# Patient Record
Sex: Female | Born: 1975 | Race: White | Hispanic: No | Marital: Married
Health system: Southern US, Community
[De-identification: ages and names within clinical notes are randomized; demographics above are authoritative.]

## PROBLEM LIST (undated history)

## (undated) HISTORY — PX: BARIATRIC SURGERY: SHX1103

## (undated) HISTORY — PX: ABDOMINAL HYSTERECTOMY: SHX81

---

## 2016-09-22 ENCOUNTER — Encounter (HOSPITAL_COMMUNITY): Payer: Self-pay | Admitting: Obstetrics and Gynecology

## 2016-09-22 DIAGNOSIS — N201 Calculus of ureter: Secondary | ICD-10-CM | POA: Insufficient documentation

## 2016-09-22 DIAGNOSIS — Z79899 Other long term (current) drug therapy: Secondary | ICD-10-CM | POA: Insufficient documentation

## 2016-09-22 DIAGNOSIS — R109 Unspecified abdominal pain: Secondary | ICD-10-CM | POA: Diagnosis present

## 2016-09-22 LAB — LIPASE, BLOOD: Lipase: 37 U/L (ref 11–51)

## 2016-09-22 LAB — CBC
HEMATOCRIT: 38.9 % (ref 36.0–46.0)
HEMOGLOBIN: 13.7 g/dL (ref 12.0–15.0)
MCH: 29.8 pg (ref 26.0–34.0)
MCHC: 35.2 g/dL (ref 30.0–36.0)
MCV: 84.7 fL (ref 78.0–100.0)
Platelets: 256 10*3/uL (ref 150–400)
RBC: 4.59 MIL/uL (ref 3.87–5.11)
RDW: 15 % (ref 11.5–15.5)
WBC: 9.7 10*3/uL (ref 4.0–10.5)

## 2016-09-22 LAB — COMPREHENSIVE METABOLIC PANEL
ALBUMIN: 4 g/dL (ref 3.5–5.0)
ALT: 38 U/L (ref 14–54)
ANION GAP: 14 (ref 5–15)
AST: 24 U/L (ref 15–41)
Alkaline Phosphatase: 65 U/L (ref 38–126)
BILIRUBIN TOTAL: 2.4 mg/dL — AB (ref 0.3–1.2)
BUN: 12 mg/dL (ref 6–20)
CHLORIDE: 103 mmol/L (ref 101–111)
CO2: 20 mmol/L — AB (ref 22–32)
Calcium: 8.9 mg/dL (ref 8.9–10.3)
Creatinine, Ser: 0.93 mg/dL (ref 0.44–1.00)
GFR calc Af Amer: >60 mL/min (ref >60)
GFR calc non Af Amer: >60 mL/min (ref >60)
GLUCOSE: 105 mg/dL — AB (ref 65–99)
POTASSIUM: 3.1 mmol/L — AB (ref 3.5–5.1)
SODIUM: 137 mmol/L (ref 135–145)
TOTAL PROTEIN: 7.3 g/dL (ref 6.5–8.1)

## 2016-09-22 LAB — I-STAT BETA HCG BLOOD, ED (MC, WL, AP ONLY): I-stat hCG, quantitative: <5 m[IU]/mL (ref <5)

## 2016-09-22 NOTE — ED Triage Notes (Signed)
Pt presents to the ED with pain in her abdomen and left side, radiating to back. Pt states she had bariatric surgery about 3 weeks ago. She has had nausea and been vomiting. Pt states she is currently vomiting bile, as she has been unable to hold down food or drink.

## 2016-09-23 ENCOUNTER — Emergency Department (HOSPITAL_COMMUNITY): Payer: Medicaid Other

## 2016-09-23 ENCOUNTER — Encounter (HOSPITAL_COMMUNITY): Payer: Self-pay | Admitting: Radiology

## 2016-09-23 ENCOUNTER — Emergency Department (HOSPITAL_COMMUNITY)
Admission: EM | Admit: 2016-09-23 | Discharge: 2016-09-23 | Disposition: A | Discharge status: Home / Self Care | Payer: Medicaid Other | Attending: Emergency Medicine | Admitting: Emergency Medicine

## 2016-09-23 DIAGNOSIS — N201 Calculus of ureter: Secondary | ICD-10-CM

## 2016-09-23 LAB — URINALYSIS, ROUTINE W REFLEX MICROSCOPIC
BACTERIA UA: NONE SEEN
Bilirubin Urine: NEGATIVE
Glucose, UA: NEGATIVE mg/dL
KETONES UR: 80 mg/dL — AB
Leukocytes, UA: NEGATIVE
Nitrite: NEGATIVE
PH: 5 (ref 5.0–8.0)
PROTEIN: 30 mg/dL — AB
Specific Gravity, Urine: >1.046 — ABNORMAL HIGH (ref 1.005–1.030)

## 2016-09-23 MED ORDER — ONDANSETRON 4 MG PO TBDP: 4.0000 mg | ORAL_TABLET | Freq: Three times a day (TID) | ORAL | 0 refills | Status: AC | PRN

## 2016-09-23 MED ORDER — HYDROMORPHONE HCL 2 MG/ML IJ SOLN
1.0000 mg | Freq: Once | INTRAMUSCULAR | Status: AC
  Administered 2016-09-23: 1 mg via INTRAVENOUS
  Filled 2016-09-23: qty 1

## 2016-09-23 MED ORDER — IOPAMIDOL (ISOVUE-300) INJECTION 61%
INTRAVENOUS | Status: AC
  Filled 2016-09-23: qty 100

## 2016-09-23 MED ORDER — SODIUM CHLORIDE 0.9 % IV BOLUS (SEPSIS)
1000.0000 mL | Freq: Once | INTRAVENOUS | Status: AC
  Administered 2016-09-23: 1000 mL via INTRAVENOUS

## 2016-09-23 MED ORDER — ONDANSETRON HCL 4 MG/2ML IJ SOLN
4.0000 mg | Freq: Once | INTRAMUSCULAR | Status: AC
  Administered 2016-09-23: 4 mg via INTRAVENOUS
  Filled 2016-09-23: qty 2

## 2016-09-23 MED ORDER — OXYCODONE-ACETAMINOPHEN 5-325 MG PO TABS
1.0000 | ORAL_TABLET | Freq: Four times a day (QID) | ORAL | 0 refills | Status: AC | PRN
Start: 2016-09-23 — End: ?

## 2016-09-23 MED ORDER — OXYCODONE-ACETAMINOPHEN 5-325 MG PO TABS
2.0000 | ORAL_TABLET | Freq: Once | ORAL | Status: AC
  Administered 2016-09-23: 2 via ORAL
  Filled 2016-09-23: qty 2

## 2016-09-23 MED ORDER — KETOROLAC TROMETHAMINE 30 MG/ML IJ SOLN
30.0000 mg | Freq: Once | INTRAMUSCULAR | Status: AC
  Administered 2016-09-23: 30 mg via INTRAVENOUS
  Filled 2016-09-23: qty 1

## 2016-09-23 MED ORDER — TAMSULOSIN HCL 0.4 MG PO CAPS: 0.4000 mg | ORAL_CAPSULE | Freq: Every day | ORAL | 0 refills | Status: AC

## 2016-09-23 MED ORDER — IOPAMIDOL (ISOVUE-300) INJECTION 61%
100.0000 mL | Freq: Once | INTRAVENOUS | Status: AC | PRN
  Administered 2016-09-23: 100 mL via INTRAVENOUS

## 2016-09-23 NOTE — ED Provider Notes (Signed)
[facility]-EMERGENCY DEPT Provider Note   CSN: [id number] Arrival date & time: 09/22/16  2207  By signing my name below, I, [name], attest that this documentation has been prepared under the direction and in the presence of [name], PA-C. Electronically Signed: [name], Scribe. 09/23/2016. 12:27 AM.  History   Chief Complaint Chief Complaint  Patient presents with  . Abdominal Pain   The history is provided by the patient and medical records. No language interpreter was used.    HPI Comments:  Grace Francis is a 41 y.o. female with a PSHx of Abdominal Hysterectomy, who presents to the Emergency Department complaining of gradually worsening, radiating, left-sided abdominal pain onset yesterday. 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 She states coming to [place] from [place] for [employer] and experiencing this abdominal pain with worsening nausea beginning yesterday. Following the procedure three weeks ago, she became dehydrated and developed C-Diff treated with 10 days of oral vancomycin. Pt reports reduced PO intake d/t frequent vomiting s/p ingestion throughout today. Prior h/o renal calculi noted. Pt has associated symptoms of diarrhea that has been present since the C-Diff infection, back pain, chills, and reduced urination. Direct palpation and pressure exacerbate her pain. Pt denies fever, dysuria, hematuria, and any other complaints at this time.   History reviewed. No pertinent past medical history.  There are no active problems to display for this patient.  Past Surgical History:  Procedure Laterality Date  . ABDOMINAL HYSTERECTOMY    . BARIATRIC SURGERY Bilateral    OB History    No data available     Home Medications    Prior to Admission medications   Medication Sig Start Date End Date Taking? Authorizing Provider  citalopram (CELEXA) 40 MG tablet Take 40 mg by mouth daily. 07/22/16  Yes  Historical Provider, MD  clonazePAM (KLONOPIN) 1 MG tablet Take 1 mg by mouth at bedtime as needed for anxiety (sleep).   Yes Historical Provider, MD  Cyanocobalamin (VITAMIN B 12 PO) Take 1 tablet by mouth daily.   Yes Historical Provider, MD  Multiple Vitamins-Minerals (BARIATRIC FUSION PO) Take 1 tablet by mouth daily.   Yes Historical Provider, MD  pantoprazole (PROTONIX) 20 MG tablet Take 20 mg by mouth daily. 08/16/16  Yes Historical Provider, MD   Family History No family history on file.  Social History Social History  Substance Use Topics  . Smoking status: Not on file  . Smokeless tobacco: Current User  . Alcohol use No    Allergies   Iodine and Tramadol   Review of Systems Review of Systems  Constitutional: Positive for appetite change and chills. Negative for fever.  HENT: Negative for rhinorrhea and sore throat.   Eyes: Negative for redness.  Respiratory: Negative for cough.   Cardiovascular: Negative for chest pain.  Gastrointestinal: Positive for abdominal pain, diarrhea, nausea and vomiting.  Genitourinary: Positive for decreased urine volume. Negative for dysuria and hematuria.  Musculoskeletal: Positive for back pain. Negative for myalgias.  Skin: Negative for rash.  Neurological: Negative for headaches.     Physical Exam Updated Vital Signs BP (!) 131/59 (BP Location: Right Arm)   Pulse 67   Temp 98 F (36.7 C) (Oral)   Resp 14   Ht  (1.702 m)   Wt (!) 330 lb (149.7 kg)   SpO2 100%   BMI 51.69 kg/m   Physical Exam  Constitutional: She appears well-developed and well-nourished.  HENT:  Head: Normocephalic and atraumatic.  Mouth/Throat: Oropharynx is clear and moist.  Eyes: Conjunctivae are normal. Right eye exhibits no discharge. Left eye exhibits no discharge.  Neck: Normal range of motion. Neck supple.  Cardiovascular: Normal rate, regular rhythm and normal heart sounds.   Pulmonary/Chest: Effort normal and breath sounds normal. No  respiratory distress. She has no wheezes. She has no rales.  Abdominal: Soft. She exhibits no distension and no mass. There is tenderness (Generalized, worse left flank and upper abdomen, mild to moderate). There is no guarding.  Postsurgical wounds appear well-healing.  Musculoskeletal: Normal range of motion.  Neurological: She is alert.  Skin: Skin is warm and dry.  Psychiatric: She has a normal mood and affect.  Nursing note and vitals reviewed.    ED Treatments / Results  DIAGNOSTIC STUDIES:  Oxygen Saturation is 95% on RA, adequate by my interpretation.    COORDINATION OF CARE:  12:24 AM Discussed treatment plan with pt at bedside including CMP, Lipase, CBC, UA, and hCG with CT A/P and pt agreed to plan. Pt will receive Zofran and Dilaudid.   Labs (all labs ordered are listed, but only abnormal results are displayed) Labs Reviewed  COMPREHENSIVE METABOLIC PANEL - Abnormal; Notable for the following:       Result Value   Potassium 3.1 (*)    CO2 20 (*)    Glucose, Bld 105 (*)    Total Bilirubin 2.4 (*)    All other components within normal limits  URINALYSIS, ROUTINE W REFLEX MICROSCOPIC - Abnormal; Notable for the following:    APPearance HAZY (*)    Specific Gravity, Urine >1.046 (*)    Hgb urine dipstick SMALL (*)    Ketones, ur 80 (*)    Protein, ur 30 (*)    Squamous Epithelial / LPF 0-5 (*)    All other components within normal limits  LIPASE, BLOOD  CBC  I-STAT BETA HCG BLOOD, ED ([facility], [facility], [facility] ONLY)    Radiology Ct Abdomen Pelvis W Contrast  Result Date: 09/23/2016 CLINICAL DATA:  Acute onset of generalized abdominal pain, radiating to the back. Nausea and vomiting. Initial encounter. EXAM: CT ABDOMEN AND PELVIS WITH CONTRAST TECHNIQUE: Multidetector CT imaging of the abdomen and pelvis was performed using the standard protocol following bolus administration of intravenous contrast. CONTRAST:  ISOVUE-300 IOPAMIDOL (ISOVUE-300) INJECTION 61% COMPARISON:   None. FINDINGS: Lower chest: The visualized lung bases are grossly clear. The visualized portions of the mediastinum are unremarkable. Hepatobiliary: The liver is unremarkable in appearance. The patient is status post cholecystectomy, with clips noted at the gallbladder fossa. The common bile duct remains normal in caliber. Pancreas: The pancreas is within normal limits. Spleen: The spleen is unremarkable in appearance. Adrenals/Urinary Tract: The adrenal glands are unremarkable in appearance. Mild-to-moderate left-sided hydronephrosis is noted, with an obstructing 8 x 6 mm stone noted proximally at the left ureteropelvic junction. Mild left-sided perinephric stranding is seen. The right kidney is unremarkable in appearance. No nonobstructing renal stones are identified. Stomach/Bowel: The patient is status post sleeve gastrectomy. The remaining stomach is grossly unremarkable. The small bowel is within normal limits. The appendix is normal in caliber, without evidence of appendicitis. The colon is unremarkable in appearance. Vascular/Lymphatic: The abdominal aorta is unremarkable in appearance. The inferior vena cava is grossly unremarkable. No retroperitoneal lymphadenopathy is seen. No pelvic sidewall lymphadenopathy is identified. Reproductive: The bladder is mildly distended and within normal limits. The uterus is grossly unremarkable in appearance. A 6.5 cm cystic  focus is noted at the left ovary. The right ovary is unremarkable in appearance. Other: No additional soft tissue abnormalities are seen. Musculoskeletal: No acute osseous abnormalities are identified. The visualized musculature is unremarkable in appearance. IMPRESSION: 1. Mild-to-moderate left-sided hydronephrosis, with an obstructing 8 x 6 mm stone noted proximally at the left ureteropelvic junction. 2. 6.5 cm left ovarian cystic focus. Pelvic ultrasound would be helpful for further evaluation, when and as deemed clinically appropriate.  Electronically Signed   By: Roanna Raider M.D.   On: 09/23/2016 01:24    Procedures Procedures (including critical care time)  Medications Ordered in ED Medications  iopamidol (ISOVUE-300) 61 % injection (not administered)  HYDROmorphone (DILAUDID) injection 1 mg (1 mg Intravenous Given 09/23/16 0034)  ondansetron (ZOFRAN) injection 4 mg (4 mg Intravenous Given 09/23/16 0034)  iopamidol (ISOVUE-300) 61 % injection 100 mL (100 mLs Intravenous Contrast Given 09/23/16 0046)  ketorolac (TORADOL) 30 MG/ML injection 30 mg (30 mg Intravenous Given 09/23/16 0241)  sodium chloride 0.9 % bolus 1,000 mL (0 mLs Intravenous Stopped 09/23/16 0404)  oxyCODONE-acetaminophen (PERCOCET/ROXICET) 5-325 MG per tablet 2 tablet (2 tablets Oral Given 09/23/16 0406)     Initial Impression / Assessment and Plan / ED Course  I have reviewed the triage vital signs and the nursing notes.  Pertinent labs & imaging results that were available during my care of the patient were reviewed by me and considered in my medical decision making (see chart for details).      Vital signs reviewed and are as follows: Vitals:   09/23/16 0245 09/23/16 0530  BP: 133/71 (!) 103/53  Pulse: 62 76  Resp: 16 15  Temp:     Pain improved with IV Dilaudid. We discussed CT results as well as blood tests at bedside. Patient notified of location and size of left ureteral stone. One dose of IV Toradol ordered.  Patients pain not resolved but improved while in emergency department. She is tolerating oral fluids now. She feels that her symptoms are well enough controlled for discharge to home at this time. She will continue oral supplements at home now that her vomiting is controlled.  Patient reports that her husband is leaving [place] this morning to pick her up and bring her home. She will call her urologist on Monday (in 2 days) for follow-up.  Patient counseled on kidney stone treatment. Urged patient to strain urine and save any  stones. Urged urology follow-up and return to [facility] if she is still in the area with any complications. Counseled patient to maintain good fluid intake.   Counseled patient on use of Flomax. Patient was provided with a CD with her images as well as report and a copy of her labs.  Patient counseled on use of narcotic pain medications. Counseled not to combine these medications with others containing tylenol. Urged not to drink alcohol, drive, or perform any other activities that requires focus while taking these medications. The patient verbalizes understanding and agrees with the plan.    Final Clinical Impressions(s) / ED Diagnoses   Final diagnoses:  Left ureteral stone   Patient with left ureteral stone as above. UA demonstrates dehydration but no infection. Normal kidney function. Hypokalemia noted. Patient will resume home supplements for this. From a surgery standpoint, wounds appear to be noninfected and she has no evidence of obstruction on CT. Feel that her symptoms are well explained by her stone.   Vitals are stable, no fever. Patient is tolerating PO's after treatment. Lungs are  clear and no signs suggestive of PNA. Low concern for appendicitis, cholecystitis, pancreatitis, ruptured viscus, UTI, kidney stone, aortic dissection, aortic aneurysm or other emergent abdominal etiology. Supportive therapy indicated with return if symptoms worsen.    New Prescriptions Discharge Medication List as of 09/23/2016  5:21 AM    START taking these medications   Details  ondansetron (ZOFRAN ODT) 4 MG disintegrating tablet Take 1 tablet (4 mg total) by mouth every 8 (eight) hours as needed for nausea or vomiting., Starting Sat 09/23/2016, Print    oxyCODONE-acetaminophen (PERCOCET/ROXICET) 5-325 MG tablet Take 1-2 tablets by mouth every 6 (six) hours as needed for severe pain., Starting Sat 09/23/2016, Print    tamsulosin (FLOMAX) 0.4 MG CAPS capsule Take 1 capsule (0.4 mg total) by mouth daily.,  Starting Sat 09/23/2016, Print       I personally performed the services described in this documentation, which was scribed in my presence. The recorded information has been reviewed and is accurate.     [name], PA-C 09/23/16 0865    Benjiman Core, MD 09/23/16 [phone]

## 2016-09-23 NOTE — Discharge Instructions (Signed)
Please read and follow all provided instructions.  Your diagnoses today include:  1. Left ureteral stone     Tests performed today include:  Urine test that showed blood in your urine and no infection  CT scan which showed a 8mm x 6mm millimeter kidney on the left side  Blood test that showed normal kidney function  Vital signs. See below for your results today.   Medications prescribed:   Percocet (oxycodone/acetaminophen) - narcotic pain medication  DO NOT drive or perform any activities that require you to be awake and alert because this medicine can make you drowsy. BE VERY CAREFUL not to take multiple medicines containing Tylenol (also called acetaminophen). Doing so can lead to an overdose which can damage your liver and cause liver failure and possibly death.   Flomax (tamsulosin) - relaxes smooth muscle to help kidney stones pass   Zofran (ondansetron) - for nausea and vomiting  Take any prescribed medications only as directed.  Home care instructions:  Follow any educational materials contained in this packet.  Please double your fluid intake for the next several days. Strain your urine and save any stones that may pass.   Follow-up instructions: Please follow-up with your urologist in the next 1 week for further evaluation of your symptoms.  If you need to return to the Emergency Department, go to [facility] and not [facility]. The urologists are located at [facility] and can better care for you at this location.  Return instructions:  If you need to return to the Emergency Department, go to [facility] and not [facility]. The urologists are located at [facility] and can better care for you at this location.   Please return to the Emergency Department if you experience worsening symptoms.  Please return if you develop fever or uncontrolled pain or vomiting.  Please return if you have any other emergent  concerns.  Additional Information:  Your vital signs today were: BP 133/71 (BP Location: Right Arm)    Pulse 62    Temp 98 F (36.7 C) (Oral)    Resp 16    Ht  (1.702 m)    Wt (!) 149.7 kg    SpO2 99%    BMI 51.69 kg/m  If your blood pressure (BP) was elevated above 135/85 this visit, please have this repeated by your doctor within one month. --------------

## 2017-09-08 IMAGING — CT CT ABD-PELV W/ CM
2 of 4 series · 16 of 46 positions shown, 18 images · IV contrast (ISOVUE)
Comparison: None.

CLINICAL DATA: Acute onset of generalized abdominal pain, radiating
to the back. Nausea and vomiting. Initial encounter.

EXAM:
CT ABDOMEN AND PELVIS WITH CONTRAST
TECHNIQUE: Multidetector CT imaging of the abdomen and pelvis was performed
using the standard protocol following bolus administration of
intravenous contrast.
CONTRAST:  100mL MFHW0T-AXX IOPAMIDOL (MFHW0T-AXX) INJECTION 61%

[Series 2: abd/pel with · axial · 0.98mm/px · z∈[-497,-57]mm · 13 of 100 slices shown, 15 images]
[im 6/100  soft-tissue]
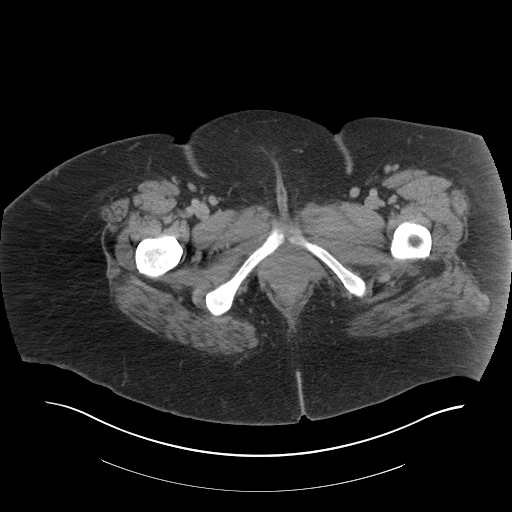
[im 6/100  bone]
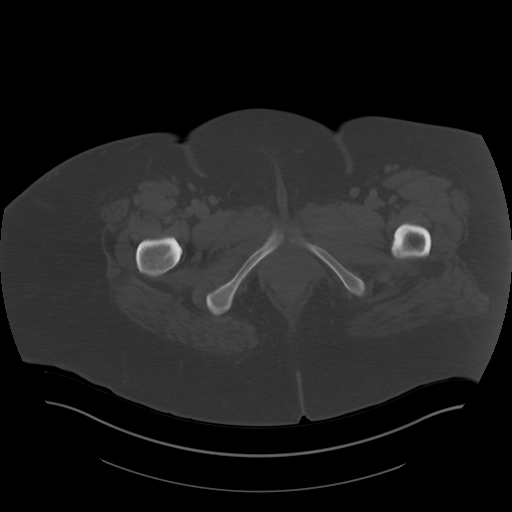
[im 12/100  soft-tissue]
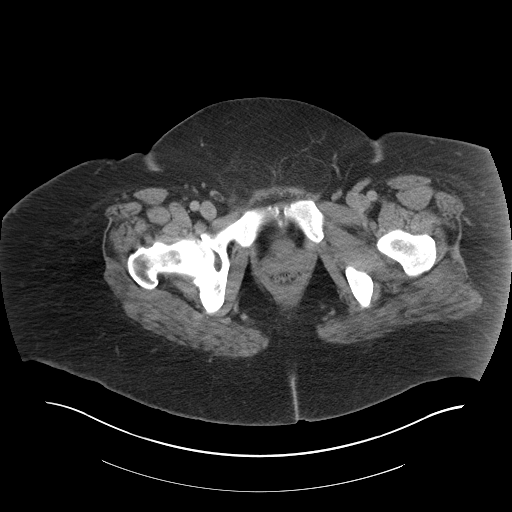
[im 24/100  soft-tissue]
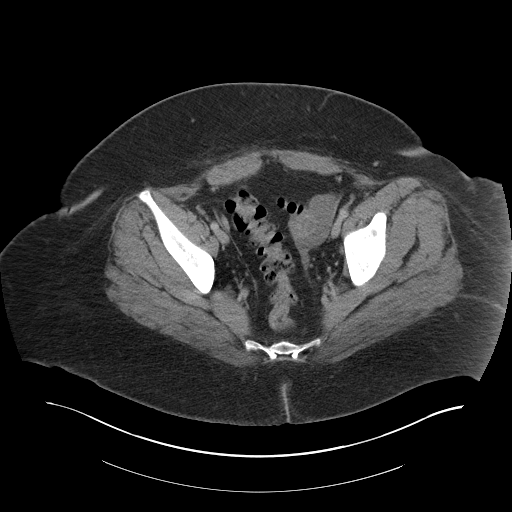
[im 30/100  soft-tissue]
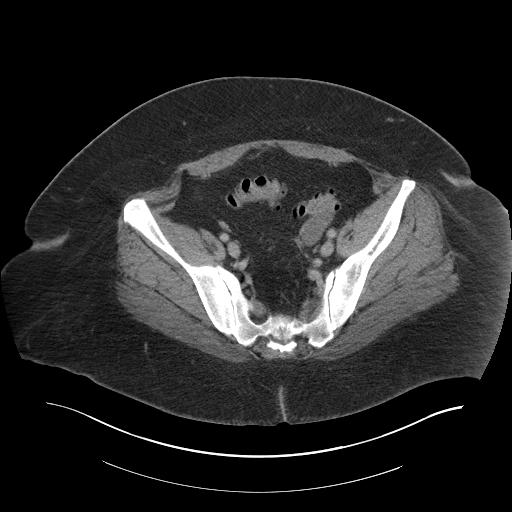
[im 35/100  soft-tissue]
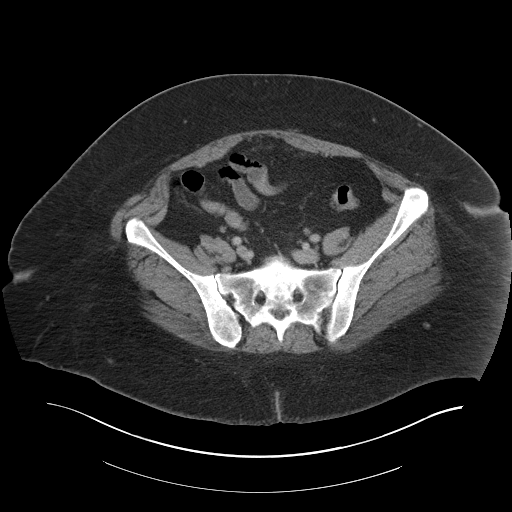
[im 41/100  soft-tissue]
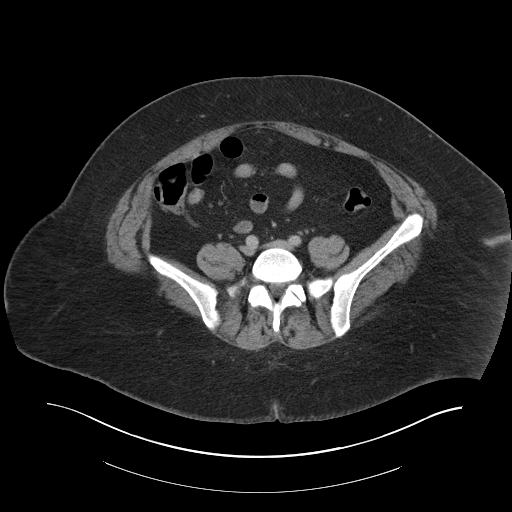
[im 53/100  soft-tissue]
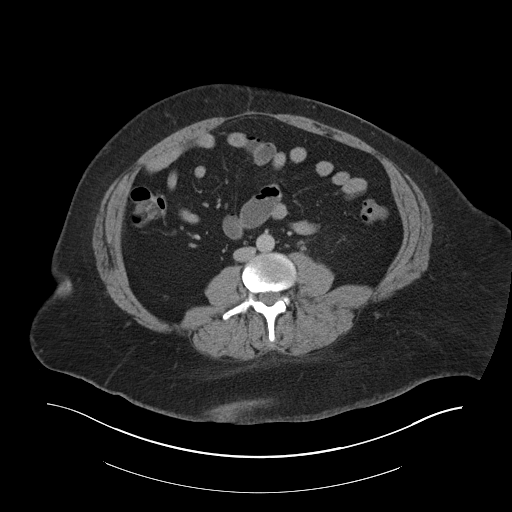
[im 59/100  soft-tissue]
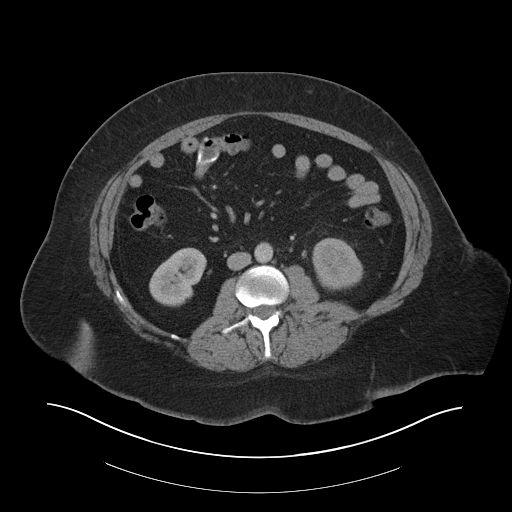
[im 65/100  soft-tissue]
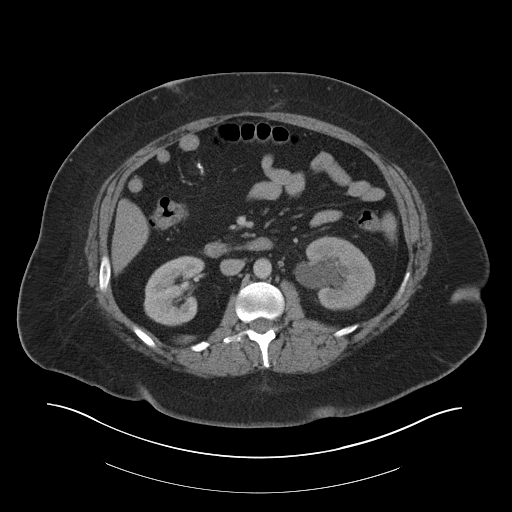
[im 65/100  bone]
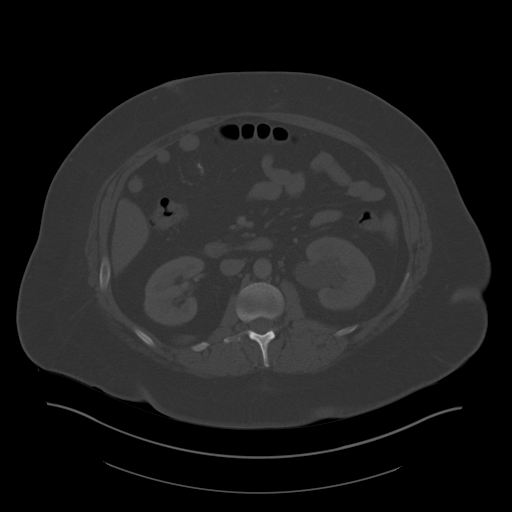
[im 70/100  soft-tissue]
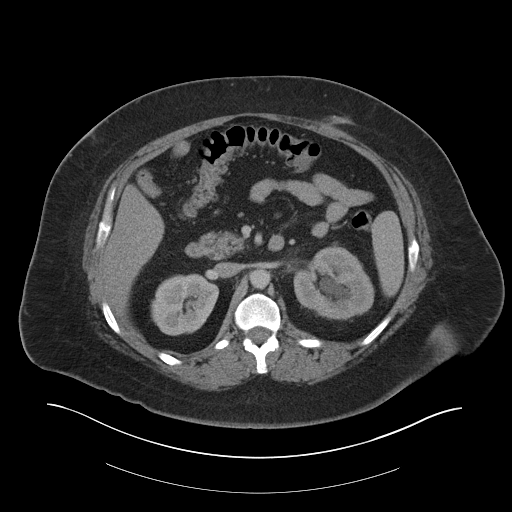
[im 76/100  soft-tissue]
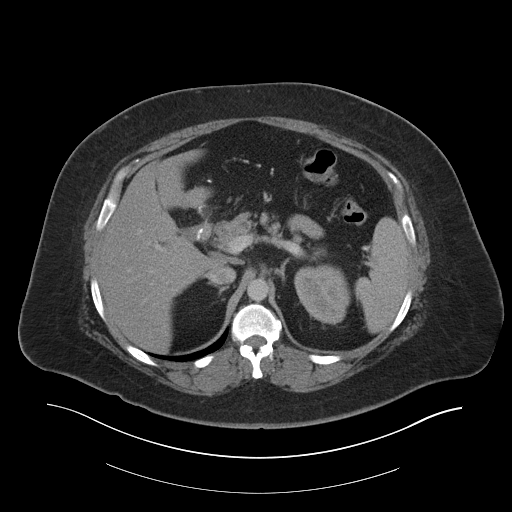
[im 88/100  soft-tissue]
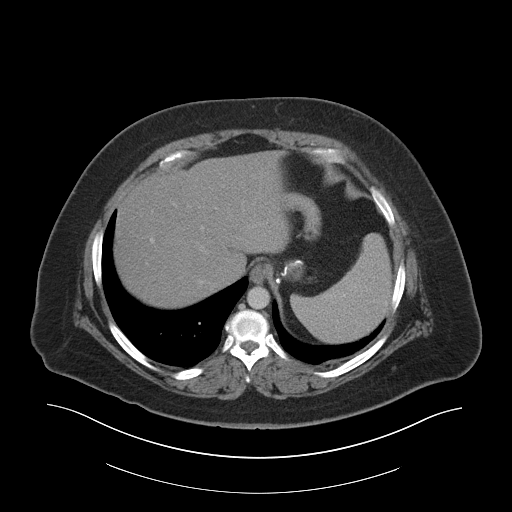
[im 94/100  soft-tissue]
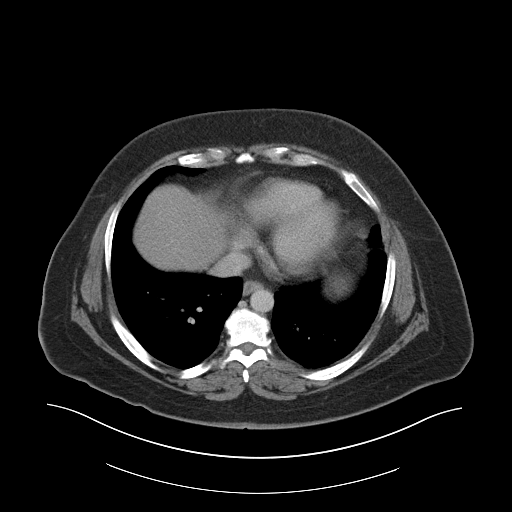

[Series 5: coronal a/|p · coronal · 0.88mm/px · 3 of 154 slices shown]
[im 52/154  soft-tissue]
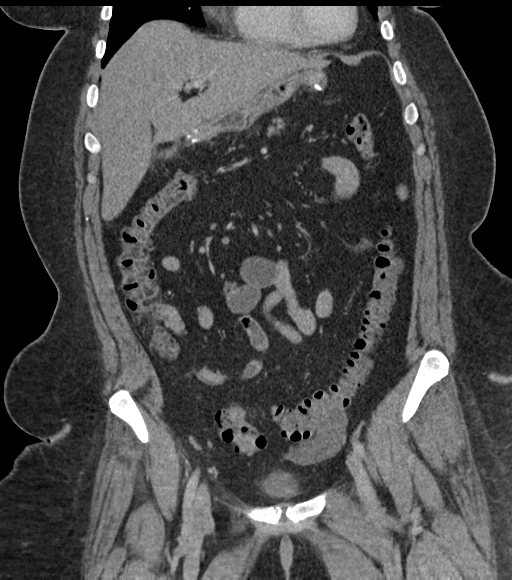
[im 69/154  soft-tissue]
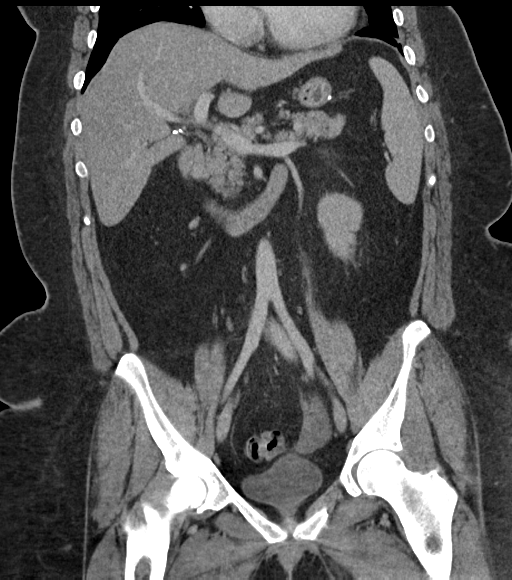
[im 86/154  soft-tissue]
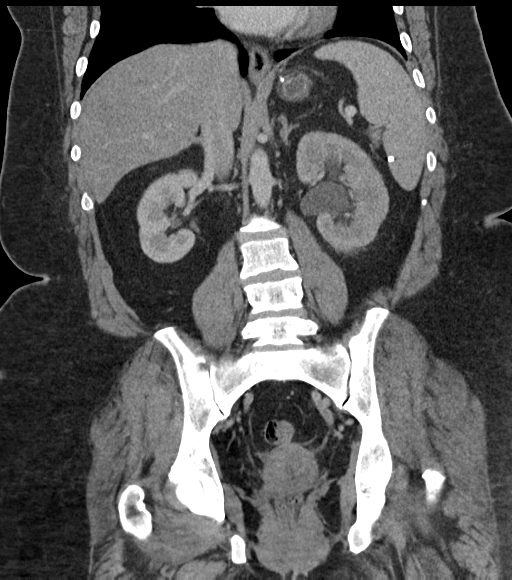

[16 of 46 positions shown; findings below may reference images not displayed]

FINDINGS: Lower chest: The visualized lung bases are grossly clear. The
visualized portions of the mediastinum are unremarkable.

Hepatobiliary: The liver is unremarkable in appearance. The patient
is status post cholecystectomy, with clips noted at the gallbladder
fossa. The common bile duct remains normal in caliber.

Pancreas: The pancreas is within normal limits.

Spleen: The spleen is unremarkable in appearance.

Adrenals/Urinary Tract: The adrenal glands are unremarkable in
appearance.

Mild-to-moderate left-sided hydronephrosis is noted, with an
obstructing 8 x 6 mm stone noted proximally at the left
ureteropelvic junction. Mild left-sided perinephric stranding is
seen.

The right kidney is unremarkable in appearance. No nonobstructing
renal stones are identified.

Stomach/Bowel: The patient is status post sleeve gastrectomy. The
remaining stomach is grossly unremarkable.

The small bowel is within normal limits. The appendix is normal in
caliber, without evidence of appendicitis. The colon is unremarkable
in appearance.

Vascular/Lymphatic: The abdominal aorta is unremarkable in
appearance. The inferior vena cava is grossly unremarkable. No
retroperitoneal lymphadenopathy is seen. No pelvic sidewall
lymphadenopathy is identified.

Reproductive: The bladder is mildly distended and within normal
limits. The uterus is grossly unremarkable in appearance.

A 6.5 cm cystic focus is noted at the left ovary. The right ovary is
unremarkable in appearance.

Other: No additional soft tissue abnormalities are seen.

Musculoskeletal: No acute osseous abnormalities are identified. The
visualized musculature is unremarkable in appearance.
IMPRESSION: 1. Mild-to-moderate left-sided hydronephrosis, with an obstructing 8
x 6 mm stone noted proximally at the left ureteropelvic junction.
2. 6.5 cm left ovarian cystic focus. Pelvic ultrasound would be
helpful for further evaluation, when and as deemed clinically
appropriate.
